# Patient Record
Sex: Female | Born: 1985 | Race: Black or African American | Hispanic: No | Marital: Single | Smoking: Former smoker
Health system: Southern US, Community
[De-identification: ages and names within clinical notes are randomized; demographics above are authoritative.]

## PROBLEM LIST (undated history)

## (undated) DIAGNOSIS — F32A Depression, unspecified: Secondary | ICD-10-CM

## (undated) DIAGNOSIS — F419 Anxiety disorder, unspecified: Secondary | ICD-10-CM

## (undated) DIAGNOSIS — F329 Major depressive disorder, single episode, unspecified: Secondary | ICD-10-CM

## (undated) HISTORY — DX: Anxiety disorder, unspecified: F41.9

## (undated) HISTORY — DX: Major depressive disorder, single episode, unspecified: F32.9

## (undated) HISTORY — DX: Depression, unspecified: F32.A

---
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
## 2003-05-05 ENCOUNTER — Emergency Department (HOSPITAL_COMMUNITY): Admission: EM | Admit: 2003-05-05 | Discharge: 2003-05-06 | Payer: Self-pay | Admitting: Emergency Medicine

## 2004-01-04 ENCOUNTER — Emergency Department (HOSPITAL_COMMUNITY): Admission: EM | Admit: 2004-01-04 | Discharge: 2004-01-04 | Payer: Self-pay | Admitting: *Deleted

## 2004-05-24 ENCOUNTER — Ambulatory Visit: Payer: Self-pay | Admitting: Family Medicine

## 2005-04-26 ENCOUNTER — Emergency Department (HOSPITAL_COMMUNITY): Admission: EM | Admit: 2005-04-26 | Discharge: 2005-04-26 | Payer: Self-pay | Admitting: Emergency Medicine

## 2006-07-23 DIAGNOSIS — E669 Obesity, unspecified: Secondary | ICD-10-CM | POA: Insufficient documentation

## 2006-07-23 DIAGNOSIS — J309 Allergic rhinitis, unspecified: Secondary | ICD-10-CM | POA: Insufficient documentation

## 2006-07-24 ENCOUNTER — Encounter (INDEPENDENT_AMBULATORY_CARE_PROVIDER_SITE_OTHER): Payer: Self-pay | Admitting: *Deleted

## 2009-07-15 ENCOUNTER — Emergency Department (HOSPITAL_COMMUNITY): Admission: EM | Admit: 2009-07-15 | Discharge: 2009-07-15 | Payer: Self-pay | Admitting: Emergency Medicine

## 2009-11-28 ENCOUNTER — Emergency Department (HOSPITAL_COMMUNITY): Admission: EM | Admit: 2009-11-28 | Discharge: 2009-11-28 | Payer: Self-pay | Admitting: Emergency Medicine

## 2010-08-11 LAB — COMPREHENSIVE METABOLIC PANEL
ALT: 17 U/L (ref 0–35)
AST: 18 U/L (ref 0–37)
Albumin: 3.8 g/dL (ref 3.5–5.2)
Alkaline Phosphatase: 69 U/L (ref 39–117)
BUN: 5 mg/dL — ABNORMAL LOW (ref 6–23)
CO2: 25 mEq/L (ref 19–32)
Calcium: 9.5 mg/dL (ref 8.4–10.5)
Chloride: 103 mEq/L (ref 96–112)
Creatinine, Ser: 0.59 mg/dL (ref 0.4–1.2)
GFR calc Af Amer: >60 mL/min (ref >60)
GFR calc non Af Amer: >60 mL/min (ref >60)
Glucose, Bld: 88 mg/dL (ref 70–99)
Potassium: 3.5 mEq/L (ref 3.5–5.1)
Sodium: 136 mEq/L (ref 135–145)
Total Bilirubin: 0.6 mg/dL (ref 0.3–1.2)
Total Protein: 8.1 g/dL (ref 6.0–8.3)

## 2010-08-11 LAB — CBC
HCT: 39.2 % (ref 36.0–46.0)
Hemoglobin: 13.6 g/dL (ref 12.0–15.0)
MCH: 30.3 pg (ref 26.0–34.0)
MCHC: 34.6 g/dL (ref 30.0–36.0)
MCV: 87.4 fL (ref 78.0–100.0)
Platelets: 220 10*3/uL (ref 150–400)
RBC: 4.48 MIL/uL (ref 3.87–5.11)
RDW: 13.5 % (ref 11.5–15.5)
WBC: 8.9 10*3/uL (ref 4.0–10.5)

## 2010-08-11 LAB — URINALYSIS, ROUTINE W REFLEX MICROSCOPIC
Bilirubin Urine: NEGATIVE
Protein, ur: NEGATIVE mg/dL
Urobilinogen, UA: 1 mg/dL (ref 0.0–1.0)

## 2010-08-11 LAB — DIFFERENTIAL
Basophils Absolute: 0.1 10*3/uL (ref 0.0–0.1)
Eosinophils Relative: 1 % (ref 0–5)
Lymphocytes Relative: 35 % (ref 12–46)

## 2010-08-11 LAB — URINE MICROSCOPIC-ADD ON

## 2010-08-11 LAB — LIPASE, BLOOD: Lipase: 27 U/L (ref 11–59)

## 2010-08-14 LAB — POCT I-STAT, CHEM 8
Chloride: 106 mEq/L (ref 96–112)
HCT: 38 % (ref 36.0–46.0)
Potassium: 3.7 mEq/L (ref 3.5–5.1)
Sodium: 139 mEq/L (ref 135–145)

## 2010-08-14 LAB — URINALYSIS, ROUTINE W REFLEX MICROSCOPIC
Nitrite: NEGATIVE
Specific Gravity, Urine: 1.015 (ref 1.005–1.030)
pH: 8 (ref 5.0–8.0)

## 2010-08-14 LAB — POCT PREGNANCY, URINE: Preg Test, Ur: NEGATIVE

## 2014-08-04 ENCOUNTER — Other Ambulatory Visit: Payer: Self-pay | Admitting: Obstetrics and Gynecology

## 2014-08-04 DIAGNOSIS — N6325 Unspecified lump in the left breast, overlapping quadrants: Secondary | ICD-10-CM

## 2014-08-04 DIAGNOSIS — N6320 Unspecified lump in the left breast, unspecified quadrant: Secondary | ICD-10-CM

## 2014-08-04 DIAGNOSIS — N6324 Unspecified lump in the left breast, lower inner quadrant: Secondary | ICD-10-CM

## 2014-08-09 ENCOUNTER — Ambulatory Visit
Admission: RE | Admit: 2014-08-09 | Discharge: 2014-08-09 | Disposition: A | Discharge status: Home / Self Care | Payer: 59 | Source: Ambulatory Visit | Attending: Obstetrics and Gynecology | Admitting: Obstetrics and Gynecology

## 2014-08-09 DIAGNOSIS — N6325 Unspecified lump in the left breast, overlapping quadrants: Secondary | ICD-10-CM

## 2014-08-09 DIAGNOSIS — N6324 Unspecified lump in the left breast, lower inner quadrant: Secondary | ICD-10-CM

## 2014-08-09 DIAGNOSIS — N6320 Unspecified lump in the left breast, unspecified quadrant: Secondary | ICD-10-CM

## 2014-09-28 ENCOUNTER — Other Ambulatory Visit (HOSPITAL_COMMUNITY): Payer: Self-pay | Admitting: Obstetrics and Gynecology

## 2014-09-28 DIAGNOSIS — N979 Female infertility, unspecified: Secondary | ICD-10-CM

## 2014-09-29 ENCOUNTER — Ambulatory Visit (HOSPITAL_COMMUNITY): Admission: RE | Admit: 2014-09-29 | Payer: 59 | Source: Ambulatory Visit

## 2014-10-02 ENCOUNTER — Encounter (INDEPENDENT_AMBULATORY_CARE_PROVIDER_SITE_OTHER): Payer: Self-pay

## 2014-10-02 ENCOUNTER — Ambulatory Visit (HOSPITAL_COMMUNITY)
Admission: RE | Admit: 2014-10-02 | Discharge: 2014-10-02 | Disposition: A | Discharge status: Home / Self Care | Payer: 59 | Source: Ambulatory Visit | Attending: Obstetrics and Gynecology | Admitting: Obstetrics and Gynecology

## 2014-10-02 DIAGNOSIS — N979 Female infertility, unspecified: Secondary | ICD-10-CM | POA: Insufficient documentation

## 2014-10-02 MED ORDER — IOHEXOL 300 MG/ML  SOLN
30.0000 mL | Freq: Once | INTRAMUSCULAR | Status: AC | PRN
  Administered 2014-10-02: 30 mL

## 2014-12-06 ENCOUNTER — Emergency Department (HOSPITAL_COMMUNITY)
Admission: EM | Admit: 2014-12-06 | Discharge: 2014-12-06 | Disposition: A | Discharge status: Home / Self Care | Payer: 59 | Attending: Emergency Medicine | Admitting: Emergency Medicine

## 2014-12-06 ENCOUNTER — Emergency Department (HOSPITAL_COMMUNITY): Payer: 59

## 2014-12-06 ENCOUNTER — Encounter (HOSPITAL_COMMUNITY): Payer: Self-pay

## 2014-12-06 DIAGNOSIS — Y9289 Other specified places as the place of occurrence of the external cause: Secondary | ICD-10-CM | POA: Diagnosis not present

## 2014-12-06 DIAGNOSIS — S3992XA Unspecified injury of lower back, initial encounter: Secondary | ICD-10-CM | POA: Insufficient documentation

## 2014-12-06 DIAGNOSIS — S0993XA Unspecified injury of face, initial encounter: Secondary | ICD-10-CM | POA: Diagnosis not present

## 2014-12-06 DIAGNOSIS — S8992XA Unspecified injury of left lower leg, initial encounter: Secondary | ICD-10-CM | POA: Diagnosis not present

## 2014-12-06 DIAGNOSIS — S199XXA Unspecified injury of neck, initial encounter: Secondary | ICD-10-CM | POA: Diagnosis not present

## 2014-12-06 DIAGNOSIS — Y9389 Activity, other specified: Secondary | ICD-10-CM | POA: Diagnosis not present

## 2014-12-06 DIAGNOSIS — Y998 Other external cause status: Secondary | ICD-10-CM | POA: Diagnosis not present

## 2014-12-06 DIAGNOSIS — S299XXA Unspecified injury of thorax, initial encounter: Secondary | ICD-10-CM | POA: Diagnosis present

## 2014-12-06 DIAGNOSIS — Z87891 Personal history of nicotine dependence: Secondary | ICD-10-CM | POA: Diagnosis not present

## 2014-12-06 DIAGNOSIS — S0990XA Unspecified injury of head, initial encounter: Secondary | ICD-10-CM | POA: Diagnosis not present

## 2014-12-06 DIAGNOSIS — Z79899 Other long term (current) drug therapy: Secondary | ICD-10-CM | POA: Diagnosis not present

## 2014-12-06 LAB — POC URINE PREG, ED: PREG TEST UR: NEGATIVE

## 2014-12-06 MED ORDER — HYDROCODONE-ACETAMINOPHEN 5-325 MG PO TABS
1.0000 | ORAL_TABLET | Freq: Once | ORAL | Status: AC
  Administered 2014-12-06: 1 via ORAL
  Filled 2014-12-06: qty 1

## 2014-12-06 MED ORDER — CYCLOBENZAPRINE HCL 10 MG PO TABS: 10.0000 mg | ORAL_TABLET | Freq: Two times a day (BID) | ORAL | Status: DC | PRN

## 2014-12-06 MED ORDER — NAPROXEN 500 MG PO TABS
500.0000 mg | ORAL_TABLET | Freq: Two times a day (BID) | ORAL | Status: DC
Start: 2014-12-06 — End: 2015-04-09
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
## 2014-12-06 NOTE — Discharge Instructions (Signed)
Assault, General  Assault includes any behavior, whether intentional or reckless, which results in bodily injury to another person and/or damage to property. Included in this would be any behavior, intentional or reckless, that by its nature would be understood (interpreted) by a reasonable person as intent to harm another person or to damage his/her property. Threats may be oral or written. They may be communicated through regular mail, computer, fax, or phone. These threats may be direct or implied.  FORMS OF ASSAULT INCLUDE:  · Physically assaulting a person. This includes physical threats to inflict physical harm as well as:  ¨ Slapping.  ¨ Hitting.  ¨ Poking.  ¨ Kicking.  ¨ Punching.  ¨ Pushing.  · Arson.  · Sabotage.  · Equipment vandalism.  · Damaging or destroying property.  · Throwing or hitting objects.  · Displaying a weapon or an object that appears to be a weapon in a threatening manner.  ¨ Carrying a firearm of any kind.  ¨ Using a weapon to harm someone.  · Using greater physical size/strength to intimidate another.  ¨ Making intimidating or threatening gestures.  ¨ Bullying.  ¨ Hazing.  · Intimidating, threatening, hostile, or abusive language directed toward another person.  ¨ It communicates the intention to engage in violence against that person. And it leads a reasonable person to expect that violent behavior may occur.  · Stalking another person.  IF IT HAPPENS AGAIN:  · Immediately call for emergency help (911 in [place]  · If someone poses clear and immediate danger to you, seek legal authorities to have a protective or restraining order put in place.  · Less threatening assaults can at least be reported to authorities.  STEPS TO TAKE IF A SEXUAL ASSAULT HAS HAPPENED  · Go to an area of safety. This may include a shelter or staying with a friend. Stay away from the area where you have been attacked. A large percentage of sexual assaults are caused by a friend, relative or associate.  · If  medications were given by your caregiver, take them as directed for the full length of time prescribed.  · Only take over-the-counter or prescription medicines for pain, discomfort, or fever as directed by your caregiver.  · If you have come in contact with a sexual disease, find out if you are to be tested again. If your caregiver is concerned about the HIV/AIDS virus, he/she may require you to have continued testing for several months.  · For the protection of your privacy, test results can not be given over the phone. Make sure you receive the results of your test. If your test results are not back during your visit, make an appointment with your caregiver to find out the results. Do not assume everything is normal if you have not heard from your caregiver or the medical facility. It is important for you to follow up on all of your test results.  · File appropriate papers with authorities. This is important in all assaults, even if it has occurred in a family or by a friend.  SEEK MEDICAL CARE IF:  · You have new problems because of your injuries.  · You have problems that may be because of the medicine you are taking, such as:  ¨ Rash.  ¨ Itching.  ¨ Swelling.  ¨ Trouble breathing.  · You develop belly (abdominal) pain, feel sick to your stomach (nausea) or are vomiting.  · You begin to run a temperature.  · You   need supportive care or referral to a rape crisis center. These are centers with trained personnel who can help you get through this ordeal.  SEEK IMMEDIATE MEDICAL CARE IF:  · You are afraid of being threatened, beaten, or abused. In [place] call 911.  · You receive new injuries related to abuse.  · You develop severe pain in any area injured in the assault or have any change in your condition that concerns you.  · You faint or lose consciousness.  · You develop chest pain or shortness of breath.  Document Released: 05/12/2005 Document Revised: 08/04/2011 Document Reviewed: 12/29/2007  ExitCare® Patient  Information ©2015 [employer]. This information is not intended to replace advice given to you by your health care provider. Make sure you discuss any questions you have with your health care provider.
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
## 2014-12-06 NOTE — ED Notes (Signed)
MD at bedside. 

## 2015-04-09 ENCOUNTER — Encounter (HOSPITAL_COMMUNITY): Payer: Self-pay | Admitting: Psychiatry

## 2015-04-09 ENCOUNTER — Other Ambulatory Visit (HOSPITAL_COMMUNITY): Payer: PRIVATE HEALTH INSURANCE | Attending: Psychiatry | Admitting: Psychiatry

## 2015-04-09 DIAGNOSIS — F419 Anxiety disorder, unspecified: Secondary | ICD-10-CM | POA: Insufficient documentation

## 2015-04-09 DIAGNOSIS — F331 Major depressive disorder, recurrent, moderate: Secondary | ICD-10-CM | POA: Diagnosis present

## 2015-04-09 DIAGNOSIS — Z87891 Personal history of nicotine dependence: Secondary | ICD-10-CM | POA: Insufficient documentation

## 2015-04-09 NOTE — Progress Notes (Signed)
    Daily Group Progress Note  Program: IOP  Group Time: 9:00-10:30  Participation Level: Active  Behavioral Response: Appropriate  Type of Therapy:  Group Therapy  Summary of Progress: Pt. Met with case manager for intake assessment.      Group Time: 10:30-12:00  Participation Level:  Active  Behavioral Response: Appropriate  Type of Therapy: Psycho-education Group  Summary of Progress: Pt. Introduced herself to the group, alert, joined in group process. Pt. Briefly discussed history of multiple losses and stressful work environment.   Nancie Neas, LPC
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
## 2015-04-10 ENCOUNTER — Encounter (HOSPITAL_COMMUNITY): Payer: Self-pay | Admitting: Psychiatry

## 2015-04-10 ENCOUNTER — Other Ambulatory Visit (HOSPITAL_COMMUNITY): Payer: PRIVATE HEALTH INSURANCE | Admitting: Psychiatry

## 2015-04-10 DIAGNOSIS — F331 Major depressive disorder, recurrent, moderate: Secondary | ICD-10-CM | POA: Insufficient documentation

## 2015-04-10 NOTE — Progress Notes (Signed)
    Daily Group Progress Note  Program: IOP  Group Time: 9:00-10:30  Participation Level: Active  Behavioral Response: Appropriate  Type of Therapy:  Group Therapy  Summary of Progress: Pt. Continues to present as alert, engaged in group process, mildly dysphoric. Pt. Participated in discussion about discharge planning with Anna Sinkita.     Group Time: 10:30-12:00  Participation Level:  Active  Behavioral Response: Appropriate  Type of Therapy: Psycho-education Group  Summary of Progress: Pt. Participated in discussion about job dissatisfaction. Pt. Reported desire to find out why she is so happy, pattern of being "committed" to a job and relationship that are not good for her. Pt. Developed awareness of fears of abandonment and sadness related to feeling that she is alone and "nobody has my back", not feeling supported by family or friend network.  Shaune PollackBrown, Jennifer B, LPC
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
## 2015-04-11 ENCOUNTER — Other Ambulatory Visit (HOSPITAL_COMMUNITY): Payer: PRIVATE HEALTH INSURANCE | Admitting: Psychiatry

## 2015-04-11 DIAGNOSIS — F331 Major depressive disorder, recurrent, moderate: Secondary | ICD-10-CM | POA: Diagnosis not present

## 2015-04-11 NOTE — Progress Notes (Signed)
    Daily Group Progress Note  Program: IOP  Group Time: 9:00-10:30  Participation Level: Active  Behavioral Response: Appropriate  Type of Therapy:  Group Therapy  Summary of Progress: Pt. Presented as alert, engaged in group process, receptive to feedback from the group. Pt. Processed grief related to death of mother at age 89 and death of her grandmother at age 29. Pt. Also processed loss of relationship with her father due to his incarceration. Pt. Has limited immediate and extended family, but has made significant network of friends. Pt. Developing awareness of over-busying herself with work, school, caring for others in order to distract from pain of loss of family relationships.      Group Time: 10:30-12:00  Participation Level:  Active  Behavioral Response: Appropriate  Type of Therapy: Psycho-education Group  Summary of Progress: Pt. Watched Shawn Achor video about the benefits of practicing gratitude, physical exercise, meditation, journaling, and random acts of kindness on mental health.  Shaune PollackBrown, Xerxes Agrusa B, LPC

## 2015-04-12 ENCOUNTER — Other Ambulatory Visit (HOSPITAL_COMMUNITY): Payer: PRIVATE HEALTH INSURANCE | Admitting: Psychiatry

## 2015-04-12 DIAGNOSIS — F331 Major depressive disorder, recurrent, moderate: Secondary | ICD-10-CM

## 2015-04-12 NOTE — Progress Notes (Signed)
    Daily Group Progress Note  Program: IOP  Group Time: 9:00-10:30  Participation Level: Active  Behavioral Response: Appropriate  Type of Therapy:  Group Therapy  Summary of Progress: Pt. Presented with bright affect, talkative, engaged in the group process. Pt. Reported that she was doing "good". Pt. Continues to go to work after group and reports that she exercises regularly. Pt. Developing awareness of pattern of coping by busying herself and recognizes how she become easily distracted.      Group Time: 10:30-12:00  Participation Level:  Active  Behavioral Response: Appropriate  Type of Therapy: Psycho-education Group  Summary of Progress: Pt. Participated in instruction on the grounding series and participated in 4-3-8 breathing and bumble bee breath.  Shaune PollackBrown, Brieanne Mignone B, LPC

## 2015-04-13 ENCOUNTER — Other Ambulatory Visit (HOSPITAL_COMMUNITY): Payer: PRIVATE HEALTH INSURANCE | Admitting: Psychiatry

## 2015-04-13 DIAGNOSIS — F331 Major depressive disorder, recurrent, moderate: Secondary | ICD-10-CM | POA: Diagnosis not present

## 2015-04-13 NOTE — Progress Notes (Signed)
    Daily Group Progress Note  Program: IOP  Group Time: 9:00-10:30  Participation Level: Active  Behavioral Response: Appropriate  Type of Therapy:  Group Therapy  Summary of Progress: Pt. Presented as alert, attentive, and engaged in the group process. Pt. Participated in discussion about letting go of expectations about where she thought she would be at this stage in her life. Pt. Reported that she thought that she would have completed her degree and be in a different place in her career, feeling discouraged and having to encourage herself to keep moving forward to pursue her goals.      Group Time: 10:30-12:00  Participation Level:  Active  Behavioral Response: Appropriate  Type of Therapy: Psycho-education Group  Summary of Progress: Pt. Participated in grief and loss group with Theda BelfastBob Hamilton.   Shaune PollackBrown, Jennifer B, LPC

## 2015-04-16 ENCOUNTER — Telehealth (HOSPITAL_COMMUNITY): Payer: Self-pay | Admitting: Psychiatry

## 2015-04-16 ENCOUNTER — Other Ambulatory Visit (HOSPITAL_COMMUNITY): Payer: PRIVATE HEALTH INSURANCE | Admitting: Psychiatry

## 2015-04-17 ENCOUNTER — Other Ambulatory Visit (HOSPITAL_COMMUNITY): Payer: PRIVATE HEALTH INSURANCE | Admitting: Psychiatry

## 2015-04-17 DIAGNOSIS — F331 Major depressive disorder, recurrent, moderate: Secondary | ICD-10-CM

## 2015-04-17 NOTE — Progress Notes (Signed)
    Daily Group Progress Note  Program: IOP  Group Time: 9:00-10:30  Participation Level: Active  Behavioral Response: Appropriate  Type of Therapy:  Group Therapy  Summary of Progress: Pt. Presents with significantly brightened affect, talkative, engaged in the group process. Pt. Discussed need to connect socially with a community and to focus on her self-care. Pt. Discussed feeling "stuck" in her current job and needing to develop a plan for transitioning. Pt. Continues to go to school nightly and reports that she works out regularly which has been important to helping her to regulate her mood and manage her weight.     Group Time: 10:30-12:00  Participation Level:  Active  Behavioral Response: Appropriate  Type of Therapy: Psycho-education Group  Summary of Progress: Pt. Watched and discussed Amy Cuddy video about use of power poses to change brain chemistry by increasing testosterone to improve assertiveness and reducing cortisol to reduce stress reactivity.  Shaune PollackBrown, Dallis Darden B, LPC

## 2015-04-18 ENCOUNTER — Other Ambulatory Visit (HOSPITAL_COMMUNITY): Payer: PRIVATE HEALTH INSURANCE | Admitting: Psychiatry

## 2015-04-18 DIAGNOSIS — F331 Major depressive disorder, recurrent, moderate: Secondary | ICD-10-CM

## 2015-04-18 NOTE — Progress Notes (Signed)
    Daily Group Progress Note  Program: IOP  Group Time: 0900-1045  Participation Level: Active  Behavioral Response: Appropriate and Sharing  Type of Therapy:  Group Therapy  Summary of Progress: Patient presented attentive and talkative.  Patient voiced that she is expected to go to a family gathering for Thanksgiving, but plans not to go.  States that she doesn't want to be around that many people.  Pt plans to visit with her Grandfather.  "I just want to be at home and rest."  Pt mentioned how taxing the week has been with getting her car fixed and getting the tags.     Group Time: 1100-1200  Participation Level:  Active  Behavioral Response: Appropriate and Sharing  Type of Therapy: Psycho-education Group  Summary of Progress: Patient participated in grief/loss group with Theda BelfastBob Hamilton, M.Div and intern (Baxter HireKristen)    Campo BonitoLARK, [name], M.Ed, CNA

## 2015-04-20 ENCOUNTER — Other Ambulatory Visit (HOSPITAL_COMMUNITY): Payer: PRIVATE HEALTH INSURANCE

## 2015-04-23 ENCOUNTER — Other Ambulatory Visit (HOSPITAL_COMMUNITY): Payer: PRIVATE HEALTH INSURANCE

## 2015-04-24 ENCOUNTER — Other Ambulatory Visit (HOSPITAL_COMMUNITY): Payer: PRIVATE HEALTH INSURANCE | Admitting: Psychiatry

## 2015-04-24 DIAGNOSIS — F331 Major depressive disorder, recurrent, moderate: Secondary | ICD-10-CM | POA: Diagnosis not present

## 2015-04-24 NOTE — Progress Notes (Signed)
    Daily Group Progress Note  Program: IOP  Group Time: 9:00-10;30  Participation Level: Active  Behavioral Response: Appropriate  Type of Therapy:  Group Therapy  Summary of Progress: Pt. Presented as talkative, insightful. Pt. Reported that Thanksgiving was difficult because family members made her feel angry and said mean things about her mother and grandmother. Pt. Discussed pattern of not asking for what she needs from significant others and consequently having her emotional needs neglected.      Group Time: 10:30-12:00  Participation Level:  Active  Behavioral Response: Appropriate  Type of Therapy: Psycho-education Group  Summary of Progress: Pt. Watched and discussed Clovia CuffKristin Neff video on the importance of self-compassion (i.e., kindness to self, recognition of common humanity, and mindfulness.)  Shaune PollackBrown, Akeelah Seppala B, LPC

## 2015-04-25 ENCOUNTER — Other Ambulatory Visit (HOSPITAL_COMMUNITY): Payer: PRIVATE HEALTH INSURANCE | Admitting: Psychiatry

## 2015-04-25 DIAGNOSIS — F331 Major depressive disorder, recurrent, moderate: Secondary | ICD-10-CM

## 2015-04-25 NOTE — Progress Notes (Signed)
    Daily Group Progress Note  Program: IOP  Group Time: 9:00-10:30  Participation Level: Active  Behavioral Response: Appropriate  Type of Therapy:  Group Therapy  Summary of Progress: Pt. Presents as talkative, engaged in the group process. Pt. Discussed work as a Psychologist, sport and exercisesignificant stressor. Pt. Has worked at her job in Clinical biochemistcustomer service for nine years and does not feel that she is living up to her full potential, is looking forward to completing her degree, and getting another job.      Group Time: 10:30-12:00  Participation Level:  Active  Behavioral Response: Appropriate  Type of Therapy: Psycho-education Group  Summary of Progress: Pt. Participated in discussion about highly sensitive people, patterns of people pleasing, and compliant boundaries in relationships.   Shaune PollackBrown, Lamisha Roussell B, LPC

## 2015-04-26 ENCOUNTER — Other Ambulatory Visit (HOSPITAL_COMMUNITY): Payer: 59 | Attending: Psychiatry | Admitting: Psychiatry

## 2015-04-26 DIAGNOSIS — F331 Major depressive disorder, recurrent, moderate: Secondary | ICD-10-CM | POA: Insufficient documentation

## 2015-04-26 NOTE — Progress Notes (Signed)
    Daily Group Progress Note  Program: IOP  Group Time: 9:00-10:30  Participation Level: Minimal  Behavioral Response: Appropriate  Type of Therapy:  Group Therapy  Summary of Progress: Pt. Presented as quiet and depressed, appeared with lower mood than previous session. Pt. Reported to the group that she did not feel like participating in the group process today.     Group Time: 10:30-12:00  Participation Level:  Active  Behavioral Response: Appropriate  Type of Therapy: Psycho-education Group  Summary of Progress: Pt. Participated in discussion about use of aromatherapy for stimulating energy and grounding and developing understanding of personality styles (i.e. Compliant, controlling, and avoidant) and how they affect development of boundaries in relationships.  Shaune PollackBrown, Nicolina Hirt B, LPC

## 2015-04-27 ENCOUNTER — Other Ambulatory Visit (HOSPITAL_COMMUNITY): Payer: 59 | Admitting: Psychiatry

## 2015-04-27 DIAGNOSIS — F331 Major depressive disorder, recurrent, moderate: Secondary | ICD-10-CM

## 2015-04-27 NOTE — Progress Notes (Signed)
    Daily Group Progress Note  Program: IOP  Group Time: 9:00-10:30  Participation Level: Active  Behavioral Response: Appropriate  Type of Therapy:  Group Therapy  Summary of Progress: Pt. Presented with brightened mood, talkative, engaged in group process, improved mood compared to yesterday's group. Pt. Reports that she went to work yesterday and it was not a good day, but she was able to calm herself and relieve her stress by going to exercise after work.     Group Time: 10:30-12:00  Participation Level:  Active  Behavioral Response: Appropriate  Type of Therapy: Psycho-education Group  Summary of Progress: Pt. Participated in grief and loss group with Theda BelfastBob Hamilton.  Shaune PollackBrown, Consepcion Utt B, LPC

## 2015-04-30 ENCOUNTER — Other Ambulatory Visit (HOSPITAL_COMMUNITY): Payer: 59 | Admitting: Psychiatry

## 2015-04-30 DIAGNOSIS — F331 Major depressive disorder, recurrent, moderate: Secondary | ICD-10-CM | POA: Diagnosis not present

## 2015-04-30 NOTE — Progress Notes (Signed)
    Daily Group Progress Note  Program: IOP  Group Time: 9:00-10:30  Participation Level: Active  Behavioral Response: Appropriate  Type of Therapy:  Psycho-education Group  Summary of Progress: Pt. Participated in medication management group with Michelle NasutiElena.     Group Time: 10:30-12:00  Participation Level:  Active  Behavioral Response: Appropriate  Type of Therapy: Group Therapy  Summary of Progress:  Pt. Presented with bright mood, talkative, supportive of other group members.  Reported that she had a good weekend, spent quality time with her boyfriend. Pt. Participated in discussion about mindfulness meditation and how it can be used to help with emotional regulation and processing distressing emotions such as anger.   [name], [name]

## 2015-05-01 ENCOUNTER — Other Ambulatory Visit (HOSPITAL_COMMUNITY): Payer: 59 | Admitting: Psychiatry

## 2015-05-01 DIAGNOSIS — F331 Major depressive disorder, recurrent, moderate: Secondary | ICD-10-CM | POA: Diagnosis not present

## 2015-05-01 NOTE — Progress Notes (Signed)
    Daily Group Progress Note  Program: IOP  Group Time: 9:00-10:30  Participation Level: Active  Behavioral Response: Appropriate  Type of Therapy:  Group Therapy  Summary of Progress: Pt. Presents with brightened mood, talkative, engaged in the group process. Pt. Reports that she continues to remain busy with work and school assignments, looking forward to end of the semester and giving herself permission to exercise self-care.     Group Time: 10:30-12:00  Participation Level:  Active  Behavioral Response: Appropriate  Type of Therapy: Psycho-education Group  Summary of Progress: Pt. Watched Best BuyBrene Brown video about use of vulnerability and destructive coping behaviors. Pt. Shared role of food in her mental health recovery and learning to enjoy exercise and prepare healthful meals for herself.  Shaune PollackBrown, Jennifer B, LPC

## 2015-05-02 ENCOUNTER — Other Ambulatory Visit (HOSPITAL_COMMUNITY): Payer: 59 | Admitting: Psychiatry

## 2015-05-02 DIAGNOSIS — F331 Major depressive disorder, recurrent, moderate: Secondary | ICD-10-CM | POA: Diagnosis not present

## 2015-05-02 NOTE — Progress Notes (Signed)
    Daily Group Progress Note  Program: IOP  Group Time: 9:00-10:30  Participation Level: Active  Behavioral Response: Appropriate  Type of Therapy:  Group Therapy  Summary of Progress: Pt. Presented as talkative, engaged in the group process. Pt. Discussed patterns of all-or-nothing thinking and how the pattern is keeping her from recommitting with her exercise and healthy eating program.      Group Time: 10:30-12:00  Participation Level:  Active  Behavioral Response: Appropriate  Type of Therapy: Psycho-education Group  Summary of Progress: Pt. Was excused from second half of group due to previously scheduled appointment.  Shaune PollackBrown, Kaylon Laroche B, LPC

## 2015-05-03 ENCOUNTER — Other Ambulatory Visit (HOSPITAL_COMMUNITY): Payer: 59 | Admitting: Psychiatry

## 2015-05-03 DIAGNOSIS — F331 Major depressive disorder, recurrent, moderate: Secondary | ICD-10-CM

## 2015-05-03 NOTE — Patient Instructions (Signed)
Patient has completed MH-IOP today.  Will follow up with [name], LCSW on 05-07-15 @ 9 a.m and Dr. [name] on 05-18-15 @ 9:30 am.  Encouraged support groups.  Will return back to work (full-time status) on 05-22-15.

## 2015-05-03 NOTE — Progress Notes (Signed)
Patient ID: Morgan DadLynita D Klingel, female   DOB: 02-29-1986, 29 y.o.   MRN: [id number] Discharge Note  Patient:  Morgan Murray is an 29 y.o., female DOB:  02-29-1986  Date of Admission:  04/10/2015  Date of Discharge:  05/03/2015  Reason for Admission:depression  IOP Course:  Ms Perrin MalteseGuest participated well.  She has gained considerable insight into herself and is much more optimistic about her future.  She has plans to continue her education and things have improved with her boyfriend.    Mental Status at Discharge: much less depressed. No suicidal thoughts  Lab Results: No results found for this or any previous visit (from the past 48 hour(s)).  No current outpatient prescriptions on file.  Axis Diagnosis:  Major depression, recurrent moderate   Level of Care:  IOP  Discharge destination:  Other:  has appointments with a psychiatrist and therapist  Is patient on multiple antipsychotic therapies at discharge:  No    Has Patient had three or more failed trials of antipsychotic monotherapy by history:  Negative  Patient phone:  [phone] (home)  Patient address:   [address] [place] [address],   Follow-up recommendations:  Activity:  continue current activity Diet:  continue current diet  Comments:  none  The patient received suicide prevention pamphlet:  Yes Belongings returned:  na  Carolanne GrumblingGerald Octave Montrose 05/03/2015, 10:17 AM
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
## 2015-05-03 NOTE — Progress Notes (Signed)
    Daily Group Progress Note  Program: IOP  Group Time: 9:00-10:30  Participation Level: Active  Behavioral Response: Appropriate  Type of Therapy:  Group Therapy  Summary of Progress: Pt. Presented with bright affect, talked appropriately, engaged in the group process. Pt. Prepared for discharge. Pt. Discussed some mild anxiety about continuing with group support with mental health association and returning to individual therapist. Pt. Expressed commitment to ongoing recovery process.     Group Time: 10:30-12:00  Participation Level:  Active  Behavioral Response: Appropriate  Type of Therapy: Psycho-education Group  Summary of Progress:  Pt. Watched and discussed Levonne HubertJia Jiang video about learning to face fears related to rejection.   Shaune PollackBrown, Jennifer B, LPC

## 2015-05-04 ENCOUNTER — Other Ambulatory Visit (HOSPITAL_COMMUNITY): Payer: 59

## 2015-05-07 ENCOUNTER — Other Ambulatory Visit (HOSPITAL_COMMUNITY): Payer: 59

## 2015-05-08 ENCOUNTER — Other Ambulatory Visit (HOSPITAL_COMMUNITY): Payer: 59

## 2015-05-09 ENCOUNTER — Other Ambulatory Visit (HOSPITAL_COMMUNITY): Payer: 59

## 2015-05-10 ENCOUNTER — Other Ambulatory Visit (HOSPITAL_COMMUNITY): Payer: 59

## 2015-05-11 ENCOUNTER — Other Ambulatory Visit (HOSPITAL_COMMUNITY): Payer: 59

## 2015-05-14 ENCOUNTER — Other Ambulatory Visit (HOSPITAL_COMMUNITY): Payer: 59

## 2015-05-15 ENCOUNTER — Other Ambulatory Visit (HOSPITAL_COMMUNITY): Payer: 59

## 2015-05-16 ENCOUNTER — Other Ambulatory Visit (HOSPITAL_COMMUNITY): Payer: 59

## 2015-05-17 ENCOUNTER — Other Ambulatory Visit (HOSPITAL_COMMUNITY): Payer: 59

## 2015-05-18 ENCOUNTER — Other Ambulatory Visit (HOSPITAL_COMMUNITY): Payer: 59

## 2015-05-22 ENCOUNTER — Other Ambulatory Visit (HOSPITAL_COMMUNITY): Payer: 59

## 2015-05-23 ENCOUNTER — Other Ambulatory Visit (HOSPITAL_COMMUNITY): Payer: 59

## 2015-05-24 ENCOUNTER — Other Ambulatory Visit (HOSPITAL_COMMUNITY): Payer: 59

## 2015-05-25 ENCOUNTER — Other Ambulatory Visit (HOSPITAL_COMMUNITY): Payer: 59
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
## 2016-08-02 ENCOUNTER — Emergency Department (HOSPITAL_COMMUNITY)
Admission: EM | Admit: 2016-08-02 | Discharge: 2016-08-02 | Disposition: A | Discharge status: Home / Self Care | Payer: 59 | Attending: Emergency Medicine | Admitting: Emergency Medicine

## 2016-08-02 ENCOUNTER — Emergency Department (HOSPITAL_COMMUNITY): Payer: 59

## 2016-08-02 ENCOUNTER — Encounter (HOSPITAL_COMMUNITY): Payer: Self-pay | Admitting: Emergency Medicine

## 2016-08-02 DIAGNOSIS — Y999 Unspecified external cause status: Secondary | ICD-10-CM | POA: Diagnosis not present

## 2016-08-02 DIAGNOSIS — S92414A Nondisplaced fracture of proximal phalanx of right great toe, initial encounter for closed fracture: Secondary | ICD-10-CM | POA: Diagnosis not present

## 2016-08-02 DIAGNOSIS — Z87891 Personal history of nicotine dependence: Secondary | ICD-10-CM | POA: Diagnosis not present

## 2016-08-02 DIAGNOSIS — Y939 Activity, unspecified: Secondary | ICD-10-CM | POA: Insufficient documentation

## 2016-08-02 DIAGNOSIS — Y929 Unspecified place or not applicable: Secondary | ICD-10-CM | POA: Insufficient documentation

## 2016-08-02 DIAGNOSIS — S99921A Unspecified injury of right foot, initial encounter: Secondary | ICD-10-CM | POA: Diagnosis present

## 2016-08-02 MED ORDER — IBUPROFEN 400 MG PO TABS
600.0000 mg | ORAL_TABLET | Freq: Once | ORAL | Status: AC
  Administered 2016-08-02: 600 mg via ORAL
  Filled 2016-08-02: qty 1

## 2016-08-02 MED ORDER — HYDROCODONE-ACETAMINOPHEN 5-325 MG PO TABS: 2.0000 | ORAL_TABLET | Freq: Four times a day (QID) | ORAL | 0 refills | Status: AC | PRN

## 2016-08-02 MED ORDER — IBUPROFEN 600 MG PO TABS: 600.0000 mg | ORAL_TABLET | Freq: Four times a day (QID) | ORAL | 0 refills | Status: AC | PRN
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
## 2016-08-02 NOTE — ED Notes (Signed)
Pt stable, ambulatory, states understanding of discharge instructions 

## 2016-08-02 NOTE — Discharge Instructions (Signed)
Please follow up with your family doctor or foot doctor  Keep toe clean and dry Continue to tape toes together Take Ibuprofen for pain/swelling Take norco for when pain is severe - do not take this medicine before driving or drink alcohol with it

## 2020-06-20 ENCOUNTER — Ambulatory Visit: Payer: 59

## 2021-09-03 ENCOUNTER — Other Ambulatory Visit: Payer: Self-pay | Admitting: Internal Medicine

## 2021-09-03 DIAGNOSIS — N63 Unspecified lump in unspecified breast: Secondary | ICD-10-CM

## 2021-09-09 ENCOUNTER — Ambulatory Visit
Admission: RE | Admit: 2021-09-09 | Discharge: 2021-09-09 | Disposition: A | Discharge status: Home / Self Care | Payer: 59 | Source: Ambulatory Visit | Attending: Internal Medicine | Admitting: Internal Medicine

## 2021-09-09 ENCOUNTER — Ambulatory Visit
Admission: RE | Admit: 2021-09-09 | Discharge: 2021-09-09 | Disposition: A | Discharge status: Home / Self Care | Payer: Medicaid Other | Source: Ambulatory Visit | Attending: Internal Medicine | Admitting: Internal Medicine

## 2021-09-09 DIAGNOSIS — N63 Unspecified lump in unspecified breast: Secondary | ICD-10-CM

## 2024-04-11 ENCOUNTER — Other Ambulatory Visit: Payer: Self-pay | Admitting: Internal Medicine

## 2024-04-11 DIAGNOSIS — N644 Mastodynia: Secondary | ICD-10-CM

## 2024-04-26 ENCOUNTER — Ambulatory Visit
Admission: RE | Admit: 2024-04-26 | Discharge: 2024-04-26 | Disposition: A | Source: Ambulatory Visit | Attending: Internal Medicine | Admitting: Internal Medicine

## 2024-04-26 ENCOUNTER — Other Ambulatory Visit: Payer: Self-pay | Admitting: Internal Medicine

## 2024-04-26 DIAGNOSIS — N644 Mastodynia: Secondary | ICD-10-CM

## 2024-04-26 DIAGNOSIS — N6322 Unspecified lump in the left breast, upper inner quadrant: Secondary | ICD-10-CM

## 2024-10-25 ENCOUNTER — Other Ambulatory Visit
